# Patient Record
Sex: Female | Born: 1980 | Race: Black or African American | Hispanic: No | Marital: Married | Smoking: Never smoker
Health system: Southern US, Community
[De-identification: ages and names within clinical notes are randomized; demographics above are authoritative.]

## PROBLEM LIST (undated history)

## (undated) DIAGNOSIS — L659 Nonscarring hair loss, unspecified: Secondary | ICD-10-CM

## (undated) DIAGNOSIS — E119 Type 2 diabetes mellitus without complications: Secondary | ICD-10-CM

## (undated) DIAGNOSIS — O24419 Gestational diabetes mellitus in pregnancy, unspecified control: Secondary | ICD-10-CM

## (undated) DIAGNOSIS — I10 Essential (primary) hypertension: Secondary | ICD-10-CM

## (undated) DIAGNOSIS — E785 Hyperlipidemia, unspecified: Secondary | ICD-10-CM

## (undated) DIAGNOSIS — M2550 Pain in unspecified joint: Secondary | ICD-10-CM

## (undated) HISTORY — DX: Pain in unspecified joint: M25.50

## (undated) HISTORY — DX: Essential (primary) hypertension: I10

## (undated) HISTORY — PX: BREAST SURGERY: SHX581

## (undated) HISTORY — DX: Hyperlipidemia, unspecified: E78.5

## (undated) HISTORY — DX: Gestational diabetes mellitus in pregnancy, unspecified control: O24.419

## (undated) HISTORY — DX: Nonscarring hair loss, unspecified: L65.9

## (undated) HISTORY — DX: Type 2 diabetes mellitus without complications: E11.9

---

## 2005-09-03 ENCOUNTER — Emergency Department (HOSPITAL_COMMUNITY): Admission: EM | Admit: 2005-09-03 | Discharge: 2005-09-03 | Payer: Self-pay | Admitting: Emergency Medicine

## 2010-01-18 ENCOUNTER — Ambulatory Visit (HOSPITAL_BASED_OUTPATIENT_CLINIC_OR_DEPARTMENT_OTHER): Admission: RE | Admit: 2010-01-18 | Discharge: 2010-01-18 | Payer: Self-pay | Admitting: Specialist

## 2010-10-25 LAB — DIFFERENTIAL
Eosinophils Absolute: 0.1 10*3/uL (ref 0.0–0.7)
Eosinophils Relative: 1 % (ref 0–5)
Lymphocytes Relative: 29 % (ref 12–46)
Lymphs Abs: 2.1 10*3/uL (ref 0.7–4.0)
Monocytes Relative: 5 % (ref 3–12)
Neutrophils Relative %: 65 % (ref 43–77)

## 2010-10-25 LAB — BASIC METABOLIC PANEL
Chloride: 109 mEq/L (ref 96–112)
GFR calc Af Amer: >60 mL/min (ref >60)
GFR calc non Af Amer: >60 mL/min (ref >60)
Potassium: 4.2 mEq/L (ref 3.5–5.1)

## 2010-10-25 LAB — CBC
HCT: 36.7 % (ref 36.0–46.0)
MCV: 86.4 fL (ref 78.0–100.0)
RBC: 4.24 MIL/uL (ref 3.87–5.11)
WBC: 7.4 10*3/uL (ref 4.0–10.5)

## 2011-07-22 ENCOUNTER — Other Ambulatory Visit: Payer: Self-pay | Admitting: Specialist

## 2011-10-28 ENCOUNTER — Other Ambulatory Visit (HOSPITAL_COMMUNITY)
Admission: RE | Admit: 2011-10-28 | Discharge: 2011-10-28 | Disposition: A | Discharge status: Home / Self Care | Payer: Managed Care, Other (non HMO) | Source: Ambulatory Visit | Attending: Obstetrics & Gynecology | Admitting: Obstetrics & Gynecology

## 2011-10-28 ENCOUNTER — Other Ambulatory Visit: Payer: Self-pay | Admitting: Obstetrics & Gynecology

## 2011-10-28 DIAGNOSIS — Z124 Encounter for screening for malignant neoplasm of cervix: Secondary | ICD-10-CM | POA: Insufficient documentation

## 2011-10-28 DIAGNOSIS — Z113 Encounter for screening for infections with a predominantly sexual mode of transmission: Secondary | ICD-10-CM | POA: Insufficient documentation

## 2011-10-28 DIAGNOSIS — Z1159 Encounter for screening for other viral diseases: Secondary | ICD-10-CM | POA: Insufficient documentation

## 2012-11-08 ENCOUNTER — Encounter (HOSPITAL_BASED_OUTPATIENT_CLINIC_OR_DEPARTMENT_OTHER): Payer: Self-pay | Admitting: Emergency Medicine

## 2012-11-08 ENCOUNTER — Emergency Department (HOSPITAL_BASED_OUTPATIENT_CLINIC_OR_DEPARTMENT_OTHER)
Admission: EM | Admit: 2012-11-08 | Discharge: 2012-11-08 | Disposition: A | Discharge status: Home / Self Care | Payer: Managed Care, Other (non HMO) | Attending: Emergency Medicine | Admitting: Emergency Medicine

## 2012-11-08 DIAGNOSIS — R1110 Vomiting, unspecified: Secondary | ICD-10-CM | POA: Insufficient documentation

## 2012-11-08 DIAGNOSIS — R197 Diarrhea, unspecified: Secondary | ICD-10-CM

## 2012-11-08 LAB — URINALYSIS, ROUTINE W REFLEX MICROSCOPIC
Ketones, ur: 15 mg/dL — AB
Leukocytes, UA: NEGATIVE
Protein, ur: NEGATIVE mg/dL
Urobilinogen, UA: 1 mg/dL (ref 0.0–1.0)

## 2012-11-08 LAB — COMPREHENSIVE METABOLIC PANEL
Albumin: 3.8 g/dL (ref 3.5–5.2)
Alkaline Phosphatase: 97 U/L (ref 39–117)
BUN: 14 mg/dL (ref 6–23)
CO2: 27 mEq/L (ref 19–32)
Chloride: 104 mEq/L (ref 96–112)
Creatinine, Ser: 0.8 mg/dL (ref 0.50–1.10)
GFR calc Af Amer: >90 mL/min (ref >90)
GFR calc non Af Amer: >90 mL/min (ref >90)
Glucose, Bld: 96 mg/dL (ref 70–99)
Potassium: 4 mEq/L (ref 3.5–5.1)
Total Bilirubin: 0.5 mg/dL (ref 0.3–1.2)

## 2012-11-08 LAB — CBC WITH DIFFERENTIAL/PLATELET
Eosinophils Relative: 0 % (ref 0–5)
HCT: 36.3 % (ref 36.0–46.0)
Lymphs Abs: 1.7 10*3/uL (ref 0.7–4.0)
MCH: 26.5 pg (ref 26.0–34.0)
MCV: 80.3 fL (ref 78.0–100.0)
Monocytes Absolute: 0.3 10*3/uL (ref 0.1–1.0)
Neutro Abs: 4.9 10*3/uL (ref 1.7–7.7)
Platelets: 357 10*3/uL (ref 150–400)
RBC: 4.52 MIL/uL (ref 3.87–5.11)

## 2012-11-08 LAB — PREGNANCY, URINE: Preg Test, Ur: NEGATIVE

## 2012-11-08 MED ORDER — ONDANSETRON HCL 4 MG/2ML IJ SOLN
4.0000 mg | Freq: Once | INTRAMUSCULAR | Status: AC
  Administered 2012-11-08: 4 mg via INTRAVENOUS
  Filled 2012-11-08: qty 2

## 2012-11-08 MED ORDER — FAMOTIDINE IN NACL 20-0.9 MG/50ML-% IV SOLN
20.0000 mg | Freq: Once | INTRAVENOUS | Status: AC
  Administered 2012-11-08: 20 mg via INTRAVENOUS
  Filled 2012-11-08: qty 50

## 2012-11-08 MED ORDER — ONDANSETRON 4 MG PO TBDP: 4.0000 mg | ORAL_TABLET | Freq: Three times a day (TID) | ORAL | Status: DC | PRN

## 2012-11-08 NOTE — ED Provider Notes (Signed)
History     CSN: [id number]  Arrival date & time 11/08/12  1008   First MD Initiated Contact with Patient 11/08/12 1209      Chief Complaint  Patient presents with  . Emesis  . Diarrhea    (Consider location/radiation/quality/duration/timing/severity/associated sxs/prior treatment) Patient is a 32 y.o. female presenting with vomiting. The history is provided by the patient. No language interpreter was used.  Emesis Severity:  Moderate Duration:  4 days Timing:  Constant Quality:  Bilious material Able to tolerate:  Liquids Progression:  Worsening Chronicity:  New Recent urination:  Normal Relieved by:  Nothing Associated symptoms: diarrhea   Associated symptoms: no abdominal pain   Risk factors: no diabetes     History reviewed. No pertinent past medical history.  Past Surgical History  Procedure Laterality Date  . Breast surgery      No family history on file.  History  Substance Use Topics  . Smoking status: Never Smoker   . Smokeless tobacco: Not on file  . Alcohol Use: Yes     Comment: socially    OB History   Grav Para Term Preterm Abortions TAB SAB Ect Mult Living                  Review of Systems  Gastrointestinal: Positive for vomiting and diarrhea. Negative for abdominal pain.  All other systems reviewed and are negative.    Allergies  Review of patient's allergies indicates no known allergies.  Home Medications  No current outpatient prescriptions on file.  BP 141/85  Pulse 95  Temp(Src) 98.7 F (37.1 C) (Oral)  Resp 18  Ht 5\' 9"  (1.753 m)  Wt 270 lb 8 oz (122.698 kg)  BMI 39.93 kg/m2  SpO2 97%  LMP 08/04/2012  Physical Exam  Nursing note and vitals reviewed. Constitutional: She is oriented to person, place, and time. She appears well-developed.  HENT:  Head: Normocephalic.  Eyes: Pupils are equal, round, and reactive to light.  Neck: Normal range of motion.  Cardiovascular: Normal rate.   Pulmonary/Chest: Effort normal.   Abdominal: Soft. Bowel sounds are normal.  Musculoskeletal: Normal range of motion.  Neurological: She is alert and oriented to person, place, and time.  Skin: Skin is warm.  Psychiatric: She has a normal mood and affect.    ED Course  Procedures (including critical care time)  Labs Reviewed  URINALYSIS, ROUTINE W REFLEX MICROSCOPIC - Abnormal; Notable for the following:    APPearance CLOUDY (*)    Ketones, ur 15 (*)    All other components within normal limits  CBC WITH DIFFERENTIAL  COMPREHENSIVE METABOLIC PANEL  PREGNANCY, URINE  CBC WITH DIFFERENTIAL  COMPREHENSIVE METABOLIC PANEL   No results found.   No diagnosis found.    MDM  Pt given IV fluids,  zofran and pepcid for symptoms.    Pt advised to return if symptoms persist.   Pt given rx for zofran.           Lonia Skinner [place], PA-C 11/08/12 1514

## 2012-11-08 NOTE — ED Notes (Signed)
Poor skin turgor.

## 2012-11-08 NOTE — ED Provider Notes (Signed)
Medical screening examination/treatment/procedure(s) were performed by non-physician practitioner and as supervising physician I was immediately available for consultation/collaboration.   Zyron Deeley III, MD 11/08/12 2037 

## 2012-11-08 NOTE — ED Notes (Addendum)
N/V/D since 11/05/12.  Unable to keep anything down.  C/o abd. pain and back pain.  LMP 08/04/12, miscarried in mid-March, hasn't had another period since.

## 2014-09-30 ENCOUNTER — Emergency Department (HOSPITAL_BASED_OUTPATIENT_CLINIC_OR_DEPARTMENT_OTHER): Payer: BLUE CROSS/BLUE SHIELD

## 2014-09-30 ENCOUNTER — Encounter (HOSPITAL_BASED_OUTPATIENT_CLINIC_OR_DEPARTMENT_OTHER): Payer: Self-pay | Admitting: *Deleted

## 2014-09-30 ENCOUNTER — Emergency Department (HOSPITAL_BASED_OUTPATIENT_CLINIC_OR_DEPARTMENT_OTHER)
Admission: EM | Admit: 2014-09-30 | Discharge: 2014-10-01 | Disposition: A | Discharge status: Home / Self Care | Payer: BLUE CROSS/BLUE SHIELD | Attending: Emergency Medicine | Admitting: Emergency Medicine

## 2014-09-30 DIAGNOSIS — Y998 Other external cause status: Secondary | ICD-10-CM | POA: Diagnosis not present

## 2014-09-30 DIAGNOSIS — Y9389 Activity, other specified: Secondary | ICD-10-CM | POA: Insufficient documentation

## 2014-09-30 DIAGNOSIS — S161XXA Strain of muscle, fascia and tendon at neck level, initial encounter: Secondary | ICD-10-CM | POA: Diagnosis not present

## 2014-09-30 DIAGNOSIS — Y92410 Unspecified street and highway as the place of occurrence of the external cause: Secondary | ICD-10-CM | POA: Insufficient documentation

## 2014-09-30 DIAGNOSIS — S199XXA Unspecified injury of neck, initial encounter: Secondary | ICD-10-CM | POA: Diagnosis present

## 2014-09-30 MED ORDER — METHOCARBAMOL 500 MG PO TABS
1000.0000 mg | ORAL_TABLET | Freq: Once | ORAL | Status: AC
  Administered 2014-09-30: 1000 mg via ORAL
  Filled 2014-09-30: qty 2

## 2014-09-30 MED ORDER — KETOROLAC TROMETHAMINE 60 MG/2ML IM SOLN
60.0000 mg | Freq: Once | INTRAMUSCULAR | Status: AC
  Administered 2014-09-30: 60 mg via INTRAMUSCULAR
  Filled 2014-09-30: qty 2
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
## 2014-10-01 ENCOUNTER — Encounter (HOSPITAL_BASED_OUTPATIENT_CLINIC_OR_DEPARTMENT_OTHER): Payer: Self-pay | Admitting: Emergency Medicine

## 2014-10-01 MED ORDER — METHOCARBAMOL 500 MG PO TABS: 500.0000 mg | ORAL_TABLET | Freq: Two times a day (BID) | ORAL | Status: DC

## 2014-10-01 MED ORDER — TRAMADOL HCL 50 MG PO TABS: 50.0000 mg | ORAL_TABLET | Freq: Four times a day (QID) | ORAL | Status: DC | PRN

## 2014-10-01 MED ORDER — NAPROXEN 375 MG PO TABS: 375.0000 mg | ORAL_TABLET | Freq: Two times a day (BID) | ORAL | Status: DC

## 2016-11-19 ENCOUNTER — Emergency Department (HOSPITAL_BASED_OUTPATIENT_CLINIC_OR_DEPARTMENT_OTHER): Payer: BLUE CROSS/BLUE SHIELD

## 2016-11-19 ENCOUNTER — Encounter (HOSPITAL_BASED_OUTPATIENT_CLINIC_OR_DEPARTMENT_OTHER): Payer: Self-pay | Admitting: *Deleted

## 2016-11-19 ENCOUNTER — Emergency Department (HOSPITAL_BASED_OUTPATIENT_CLINIC_OR_DEPARTMENT_OTHER)
Admission: EM | Admit: 2016-11-19 | Discharge: 2016-11-19 | Disposition: A | Discharge status: Home / Self Care | Payer: BLUE CROSS/BLUE SHIELD | Attending: Emergency Medicine | Admitting: Emergency Medicine

## 2016-11-19 DIAGNOSIS — W108XXA Fall (on) (from) other stairs and steps, initial encounter: Secondary | ICD-10-CM | POA: Insufficient documentation

## 2016-11-19 DIAGNOSIS — O9A211 Injury, poisoning and certain other consequences of external causes complicating pregnancy, first trimester: Secondary | ICD-10-CM | POA: Diagnosis present

## 2016-11-19 DIAGNOSIS — Y92009 Unspecified place in unspecified non-institutional (private) residence as the place of occurrence of the external cause: Secondary | ICD-10-CM | POA: Diagnosis not present

## 2016-11-19 DIAGNOSIS — S93402A Sprain of unspecified ligament of left ankle, initial encounter: Secondary | ICD-10-CM | POA: Insufficient documentation

## 2016-11-19 DIAGNOSIS — W19XXXA Unspecified fall, initial encounter: Secondary | ICD-10-CM

## 2016-11-19 DIAGNOSIS — Y9389 Activity, other specified: Secondary | ICD-10-CM | POA: Insufficient documentation

## 2016-11-19 DIAGNOSIS — Z331 Pregnant state, incidental: Secondary | ICD-10-CM

## 2016-11-19 DIAGNOSIS — Y999 Unspecified external cause status: Secondary | ICD-10-CM | POA: Insufficient documentation

## 2016-11-19 DIAGNOSIS — Z3A01 Less than 8 weeks gestation of pregnancy: Secondary | ICD-10-CM | POA: Insufficient documentation

## 2016-11-19 LAB — PREGNANCY, URINE: Preg Test, Ur: POSITIVE — AB

## 2016-11-19 MED ORDER — PRENATAL COMPLETE 14-0.4 MG PO TABS: 1.0000 | ORAL_TABLET | Freq: Every day | ORAL | 2 refills | Status: AC

## 2016-11-19 NOTE — ED Triage Notes (Signed)
Pt reports left ankle pain since falling off the porch last night.  Denies other injuries.  No meds PTA

## 2016-11-19 NOTE — ED Provider Notes (Signed)
[facility] Provider [facility]   CSN: [id number] Arrival date & time: 11/19/16  1050     History   Chief Complaint Chief Complaint  Patient presents with  . Fall    HPI Pamela Carroll is a 36 y.o. female who presents to the ED with complaints of left ankle pain since yesterday after a mechanical fall. Patient states that she was coming down a few steps on a porch, and twisted her ankle causing her to fall, she denies any head injury or abdominal trauma, denies any other injuries sustained aside from the ankle. She describes her ankle pain is 8/10 constant throbbing nonradiating left ankle pain, worse with weightbearing and moving/twisting her ankle, and mildly improved with ice. She reports associated swelling, bruising, and some tingling in the fourth and fifth toes. She denies any head injury, LOC, fevers, chills, CP, SOB, abd trauma, abd pain, N/V/D/C, hematuria, dysuria, vaginal bleeding/discharge, myalgias, other extremity injuries, numbness, focal weakness, wounds/abrasions, or any other complaints at this time.    The history is provided by the patient and medical records. No language interpreter was used.  Ankle Pain   The incident occurred yesterday. The incident occurred at home. The injury mechanism was a fall. The pain is present in the left ankle. The quality of the pain is described as throbbing. The pain is at a severity of 8/10. The pain is moderate. The pain has been constant since onset. Associated symptoms include inability to bear weight and tingling. Pertinent negatives include no numbness, no muscle weakness and no loss of sensation. She reports no foreign bodies present. The symptoms are aggravated by bearing weight. She has tried ice for the symptoms. The treatment provided mild relief.    History reviewed. No pertinent past medical history.  There are no active problems to display for this patient.   Past Surgical History:  Procedure Laterality Date    . BREAST SURGERY      OB History    Gravida Para Term Preterm AB Living   1             SAB TAB Ectopic Multiple Live Births                   Home Medications    Prior to Admission medications   Not on File    Family History History reviewed. No pertinent family history.  Social History Social History  Substance Use Topics  . Smoking status: Never Smoker  . Smokeless tobacco: Not on file  . Alcohol use Yes     Comment: socially     Allergies   Patient has no known allergies.   Review of Systems Review of Systems  Constitutional: Negative for chills and fever.  HENT: Negative for facial swelling (no head inj).   Respiratory: Negative for shortness of breath.   Cardiovascular: Negative for chest pain.  Gastrointestinal: Negative for abdominal pain, constipation, diarrhea, nausea and vomiting.  Genitourinary: Negative for dysuria, hematuria, vaginal bleeding and vaginal discharge.  Musculoskeletal: Positive for arthralgias and joint swelling. Negative for myalgias.  Skin: Positive for color change. Negative for wound.  Allergic/Immunologic: Negative for immunocompromised state.  Neurological: Positive for tingling. Negative for syncope, weakness and numbness.  Psychiatric/Behavioral: Negative for confusion.   10 Systems reviewed and are negative for acute change except as noted in the HPI.   Physical Exam Updated Vital Signs BP 139/82 (BP Location: Left Arm)   Pulse 100   Temp 98.4 F (36.9 C) (Oral)  Resp 18   Ht  (1.727 m)   Wt 131.5 kg   LMP 10/09/2016 (Exact Date)   SpO2 98%   BMI 44.09 kg/m   Physical Exam  Constitutional: She is oriented to person, place, and time. Vital signs are normal. She appears well-developed and well-nourished.  Non-toxic appearance. No distress.  Afebrile, nontoxic, NAD  HENT:  Head: Normocephalic and atraumatic.  Mouth/Throat: Oropharynx is clear and moist and mucous membranes are normal.  Eyes: Conjunctivae  and EOM are normal. Right eye exhibits no discharge. Left eye exhibits no discharge.  Neck: Normal range of motion. Neck supple.  Cardiovascular: Normal rate, regular rhythm, normal heart sounds and intact distal pulses.  Exam reveals no gallop and no friction rub.   No murmur heard. Pulmonary/Chest: Effort normal and breath sounds normal. No respiratory distress. She has no decreased breath sounds. She has no wheezes. She has no rhonchi. She has no rales.  Abdominal: Soft. Normal appearance and bowel sounds are normal. She exhibits no distension. There is no tenderness. There is no rigidity, no rebound, no guarding, no CVA tenderness, no tenderness at McBurney's point and negative Murphy's sign.  Obese which limits exam slightly, but overall Soft, NTND, +BS throughout, no r/g/r, neg murphy's, neg mcburney's, no CVA TTP   Musculoskeletal: Normal range of motion.       Left ankle: She exhibits swelling and ecchymosis. She exhibits normal range of motion, no deformity, no laceration and normal pulse. Tenderness. Lateral malleolus and medial malleolus tenderness found. Achilles tendon normal.  L ankle with FROM intact, +swelling, no crepitus or deformity, with mild TTP of the lateral and medial malleoli, but no TTP or swelling of fore foot or calf. No break in skin. No bruising or erythema. No warmth. Achilles intact. Good pedal pulse and cap refill of all toes. Wiggling toes without difficulty. Sensation grossly intact. Soft compartments. No other areas of tenderness to remainder of leg.   Neurological: She is alert and oriented to person, place, and time. She has normal strength. No sensory deficit.  Skin: Skin is warm, dry and intact. No rash noted.  Psychiatric: She has a normal mood and affect.  Nursing note and vitals reviewed.    ED Treatments / Results  Labs (all labs ordered are listed, but only abnormal results are displayed) Labs Reviewed  PREGNANCY, URINE - Abnormal; Notable for the  following:       Result Value   Preg Test, Ur POSITIVE (*)    All other components within normal limits    EKG  EKG Interpretation None       Radiology Dg Ankle Complete Left  Result Date: 11/19/2016 CLINICAL DATA:  36 year old female with LEFT lateral malleolus and prox foot pain EXAM: LEFT ANKLE COMPLETE - 3+ VIEW COMPARISON:  None. FINDINGS: Ankle mortise intact. The talar dome is normal. No malleolar fracture. The calcaneus is normal. Soft tissue swelling over the lateral malleolus. IMPRESSION: 1.  No fracture or dislocation. 2. Soft tissue swelling of the lateral malleolus. Electronically Signed   By: Genevive Bi M.D.   On: 11/19/2016 13:39   Dg Foot Complete Left  Result Date: 11/19/2016 CLINICAL DATA:  Fall last night.  Pain. EXAM: LEFT FOOT - COMPLETE 3+ VIEW COMPARISON:  None. FINDINGS: No acute fracture. No dislocation. Unremarkable soft tissues. Spurring of the posterior and inferior calcaneus. IMPRESSION: No acute bony pathology. Electronically Signed   By: Jolaine Click M.D.   On: 11/19/2016 13:36  Procedures Procedures (including critical care time)  Medications Ordered in ED Medications - No data to display   Initial Impression / Assessment and Plan / ED Course  I have reviewed the triage vital signs and the nursing notes.  Pertinent labs & imaging results that were available during my care of the patient were reviewed by me and considered in my medical decision making (see chart for details).     36 y.o. female here with mechanical fall c/o L ankle pain/swelling/bruising. States last night she twisted her ankle coming down a porch step, states it caused her to fall but denies any other injuries sustained. On exam, mild swelling/bruising/tenderness to lateral malleolus, mild TTP to medial malleolus as well. FROM intact, NVI with soft compartments. No abdominal tenderness, no other areas of tenderness. Xrays pending. Unsure why but pt had Upreg done;  during initial eval, pt's husband was present and when I went to discuss the fact that a Upreg had been done, she began mouthing the words "shut up" to me. I did not give her the results at that time, will have her husband removed from the room and go back in and discuss the results, as well as advise pt that her behavior is inappropriate. She denies any abd pain/vaginal complaints, at this time I doubt we need to do any further work up regarding the Upreg+, but will further discuss with pt once I go back in. Will reassess shortly  2:00 PM Xrays negative; ankle injury likely sprain. Once I discussed that the pt's behavior earlier was inappropriate, she got agitated and upset because she states that my job was only to evaluate her ankle; I advised her that her pregnancy test was positive and she reported that she already knew that; she continued to be very rude and inappropriate and after multiple attempts at calming her down, I advised her that I would be leaving and that she can f/up with her OBGYN for ongoing prenatal care. Will start on prenatals. For her ankle, advised tylenol use, RICE, crutches for comfort, ankle brace, and f/up with ortho in 1-2wks for recheck of symptoms. I explained the diagnosis and have given explicit precautions to return to the ER including for any other new or worsening symptoms. The patient reported understanding and accepted the medical plan as it's been dictated and I have answered their questions, despite her being very agitated when I left the room. Discharge instructions concerning home care and prescriptions have been given. The patient is STABLE and is discharged to home in good condition.    Final Clinical Impressions(s) / ED Diagnoses   Final diagnoses:  Sprain of left ankle, unspecified ligament, initial encounter  Fall in home, initial encounter  Pregnancy, incidental    New Prescriptions New Prescriptions   PRENATAL VIT-FE FUMARATE-FA (PRENATAL COMPLETE)  14-0.4 MG TABS    Take 1 tablet by mouth daily after breakfast.     [address] PA-C 11/19/16 1402    Vanetta Mulders, MD 11/20/16 [phone]

## 2016-11-19 NOTE — ED Notes (Signed)
Pt stated she did not want crutches that she had some at home.

## 2016-11-19 NOTE — Discharge Instructions (Signed)
Wear ankle brace for at least 2 weeks for stabilization of ankle. Use crutches as needed for comfort. Ice and elevate ankle throughout the day, using ice pack for no more than 20 minutes every hour.  Use tylenol as needed for pain. Follow up with the orthopedist in the next 5-7 days for recheck of your ankle pain and ongoing management of your sprain. Return to the ER for changes or worsening symptoms.  Your pregnancy test was positive here. Start taking prenatal vitamins. Stay well hydrated and get plenty of rest. Follow up with your OBGYN in 1 week for ongoing prenatal care. [facility] any emergent changes/worsening conditions.

## 2016-12-08 ENCOUNTER — Ambulatory Visit: Payer: Managed Care, Other (non HMO) | Admitting: Registered"

## 2017-10-01 IMAGING — DX DG ANKLE COMPLETE 3+V*L*
3 series · 3 of 3 positions shown · non-contrast
Comparison: None.

CLINICAL DATA: [age] year old female with LEFT lateral
malleolus and prox foot pain

EXAM:
LEFT ANKLE COMPLETE - 3+ VIEW

[ankle ap]
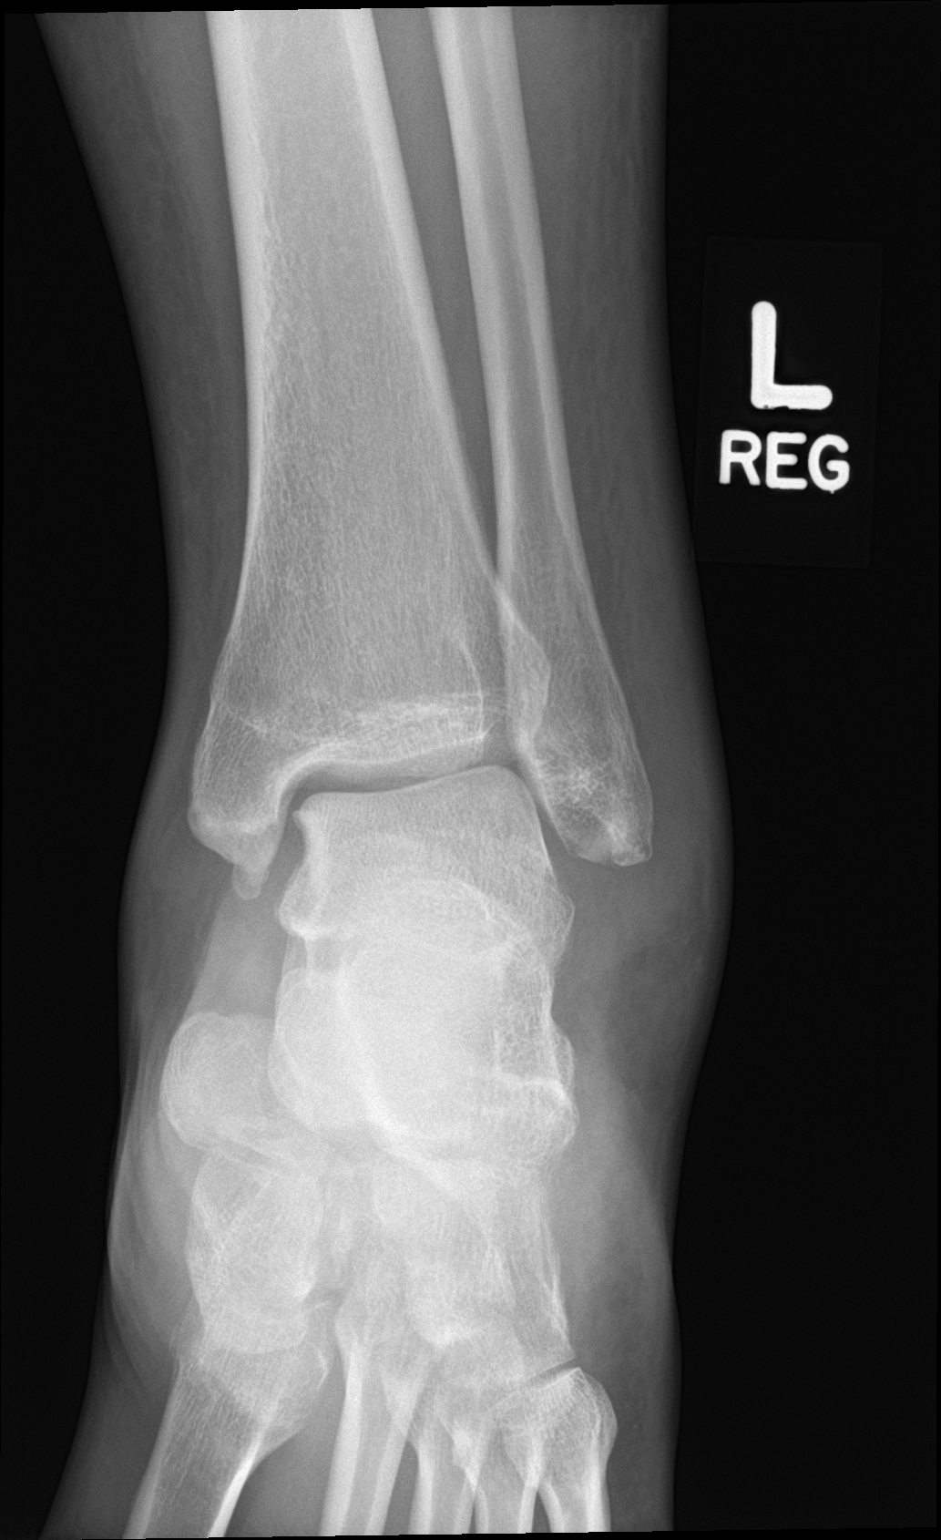

[ankle obl]
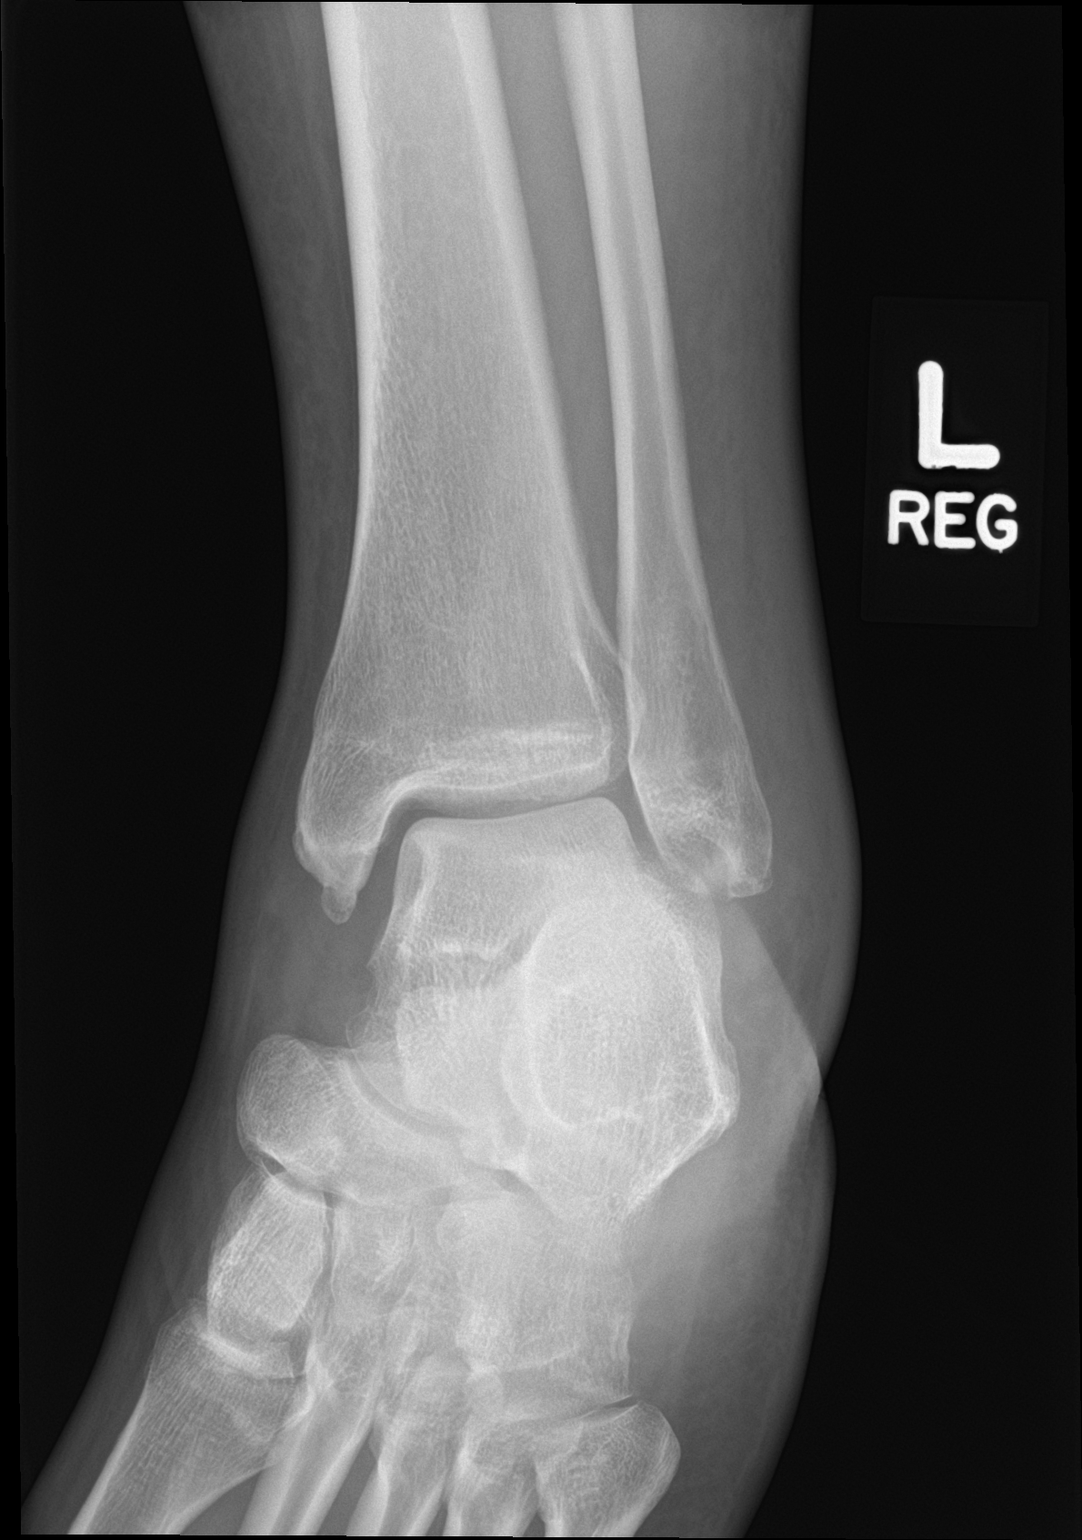

[ankle lat]
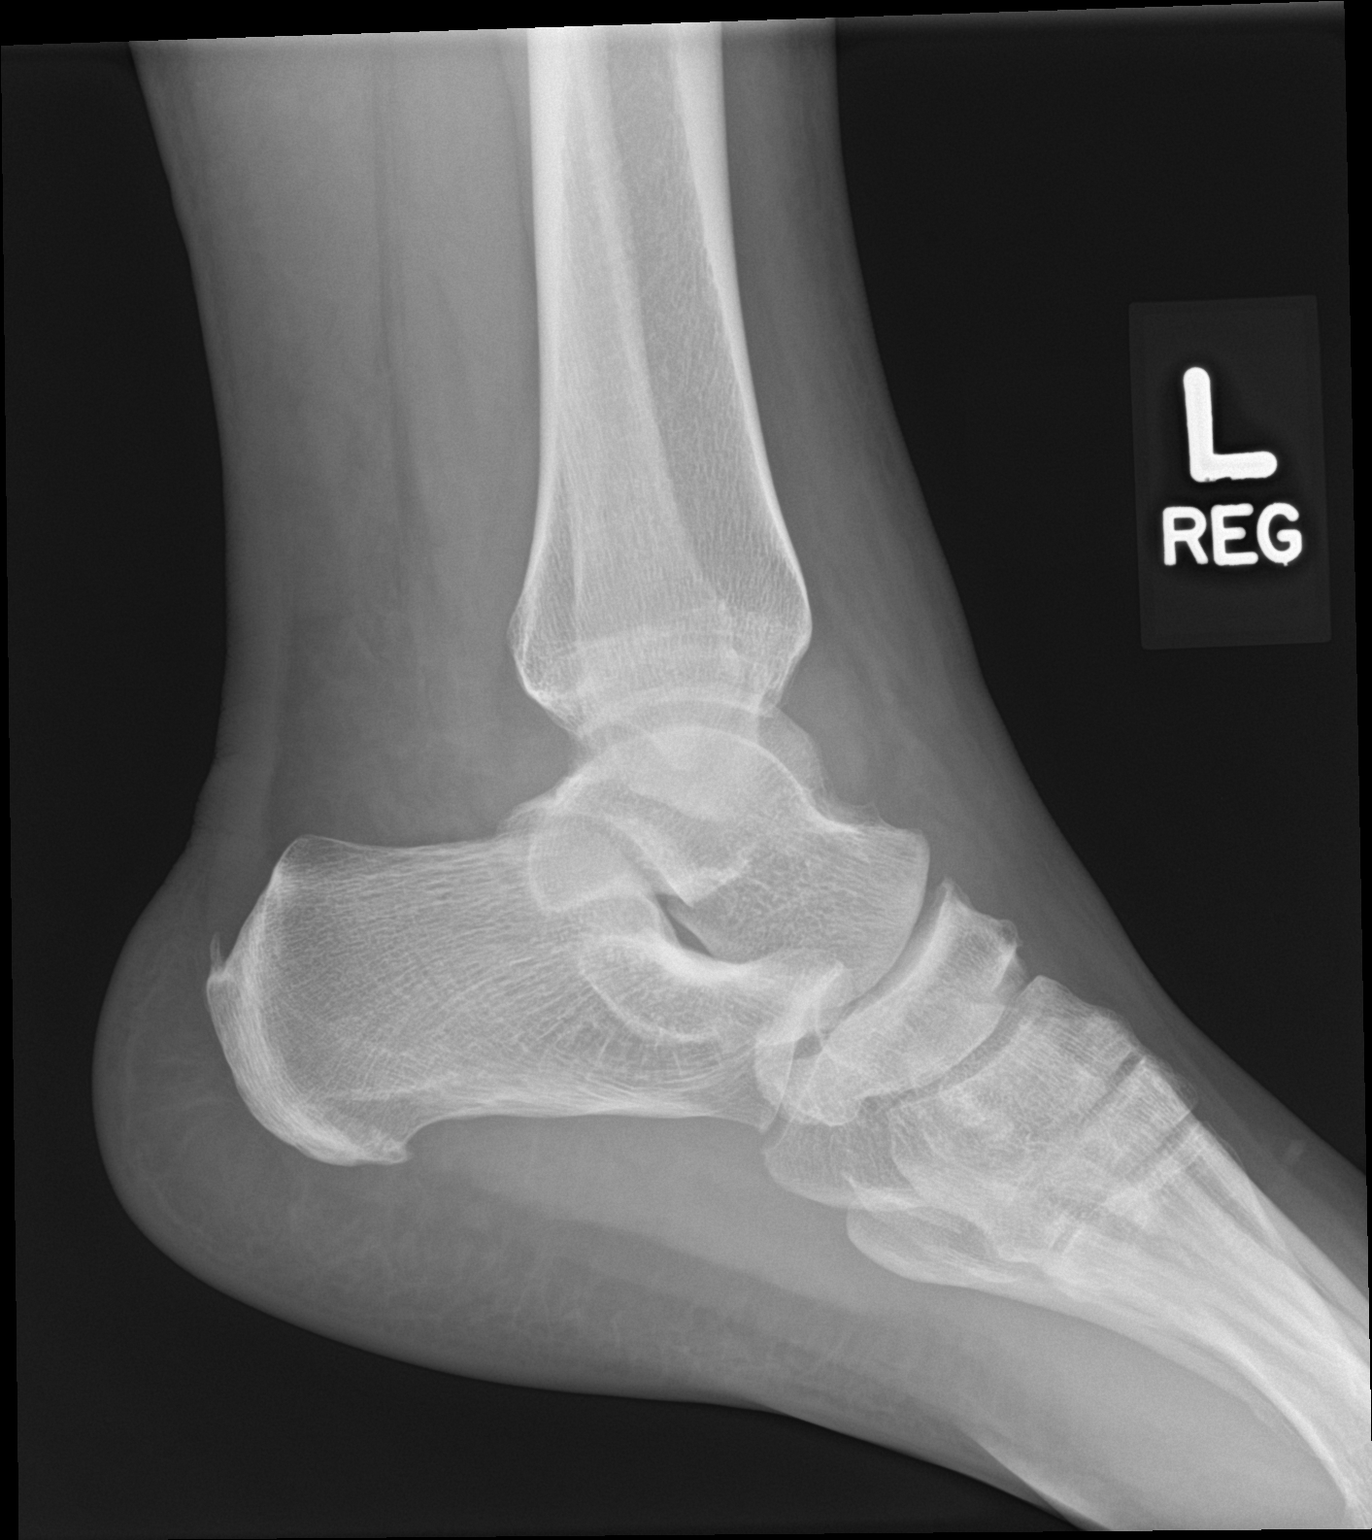

[3 of 3 positions shown; findings below may reference images not displayed]

FINDINGS: Ankle mortise intact. The talar dome is normal. No malleolar
fracture. The calcaneus is normal. Soft tissue swelling over the
lateral malleolus.
IMPRESSION: 1.  No fracture or dislocation.
2. Soft tissue swelling of the lateral malleolus.

## 2021-03-22 ENCOUNTER — Emergency Department (HOSPITAL_BASED_OUTPATIENT_CLINIC_OR_DEPARTMENT_OTHER): Payer: 59

## 2021-03-22 ENCOUNTER — Emergency Department (HOSPITAL_BASED_OUTPATIENT_CLINIC_OR_DEPARTMENT_OTHER)
Admission: EM | Admit: 2021-03-22 | Discharge: 2021-03-22 | Disposition: A | Discharge status: Home / Self Care | Payer: 59 | Attending: Emergency Medicine | Admitting: Emergency Medicine

## 2021-03-22 ENCOUNTER — Encounter (HOSPITAL_BASED_OUTPATIENT_CLINIC_OR_DEPARTMENT_OTHER): Payer: Self-pay | Admitting: *Deleted

## 2021-03-22 ENCOUNTER — Other Ambulatory Visit: Payer: Self-pay

## 2021-03-22 DIAGNOSIS — Y92410 Unspecified street and highway as the place of occurrence of the external cause: Secondary | ICD-10-CM | POA: Diagnosis not present

## 2021-03-22 DIAGNOSIS — S161XXA Strain of muscle, fascia and tendon at neck level, initial encounter: Secondary | ICD-10-CM | POA: Diagnosis not present

## 2021-03-22 DIAGNOSIS — S39012A Strain of muscle, fascia and tendon of lower back, initial encounter: Secondary | ICD-10-CM | POA: Diagnosis not present

## 2021-03-22 DIAGNOSIS — S3992XA Unspecified injury of lower back, initial encounter: Secondary | ICD-10-CM | POA: Diagnosis present

## 2021-03-22 DIAGNOSIS — S0990XA Unspecified injury of head, initial encounter: Secondary | ICD-10-CM | POA: Insufficient documentation

## 2021-03-22 LAB — PREGNANCY, URINE: Preg Test, Ur: NEGATIVE
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
## 2021-03-22 NOTE — Discharge Instructions (Signed)
Take Naprosyn or over-the-counter Aleve 500 mg every 12 hours.  Work note provided.  Expect to be sore and stiff for the next few days.  Follow-up with your doctors as needed.
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
## 2022-02-19 LAB — GLUCOSE, POCT (MANUAL RESULT ENTRY): POC Glucose: 123 mg/dl — AB (ref 70–99)

## 2022-10-06 ENCOUNTER — Other Ambulatory Visit: Payer: Self-pay | Admitting: Nurse Practitioner

## 2022-10-06 DIAGNOSIS — E119 Type 2 diabetes mellitus without complications: Secondary | ICD-10-CM

## 2022-10-06 DIAGNOSIS — I10 Essential (primary) hypertension: Secondary | ICD-10-CM

## 2022-11-24 ENCOUNTER — Encounter (HOSPITAL_BASED_OUTPATIENT_CLINIC_OR_DEPARTMENT_OTHER): Payer: Self-pay | Admitting: Family

## 2022-11-24 ENCOUNTER — Ambulatory Visit (HOSPITAL_BASED_OUTPATIENT_CLINIC_OR_DEPARTMENT_OTHER): Payer: BC Managed Care – PPO | Admitting: Family

## 2022-11-24 VITALS — BP 162/94 | HR 73 | Ht 69.0 in | Wt 262.0 lb

## 2022-11-24 DIAGNOSIS — I10 Essential (primary) hypertension: Secondary | ICD-10-CM

## 2022-11-24 DIAGNOSIS — Z6838 Body mass index (BMI) 38.0-38.9, adult: Secondary | ICD-10-CM | POA: Diagnosis not present

## 2022-11-24 DIAGNOSIS — E782 Mixed hyperlipidemia: Secondary | ICD-10-CM

## 2022-11-24 DIAGNOSIS — G4730 Sleep apnea, unspecified: Secondary | ICD-10-CM

## 2022-11-24 DIAGNOSIS — R0683 Snoring: Secondary | ICD-10-CM

## 2022-11-24 DIAGNOSIS — Z006 Encounter for examination for normal comparison and control in clinical research program: Secondary | ICD-10-CM

## 2022-11-24 NOTE — Addendum Note (Signed)
Addended by: Marlene Lard on: 11/24/2022 01:36 PM   Modules accepted: Orders

## 2022-11-24 NOTE — Patient Instructions (Addendum)
Medication Instructions:  None ordered today  Based on blood pressure readings we may think about a medication called Amlodipine.    Labwork: Your physician recommends that you return for lab work within the next two weeks for catecholamines, metanephrines, thyroid panel, BMP. This is to look for any secondary causes of high blood pressure.    Testing/Procedures: Your physician has requested that you have a renal artery duplex. During this test, an ultrasound is used to evaluate blood flow to the kidneys. Allow one hour for this exam. Do not eat after midnight the day before and avoid carbonated beverages. Take your medications as you usually do.  WatchPAT?  Is a FDA cleared portable home sleep study test that uses a watch and 3 points of contact to monitor 7 different channels, including your heart rate, oxygen saturations, body position, snoring, and chest motion.  The study is easy to use from the comfort of your own home and accurately detect sleep apnea.  Before bed, you attach the chest sensor, attached the sleep apnea bracelet to your nondominant hand, and attach the finger probe.  After the study, the raw data is downloaded from the watch and scored for apnea events.   For more information: [email or web address]  Patient Testing Instructions:  Do not put battery into the device until bedtime when you are ready to begin the test. Please call the support number if you need assistance after following the instructions below: 24 hour support line- [phone] or [facility] support at [phone] (option 2)  Download the IntelWatchPAT One" app through the google play store or App Store  Be sure to turn on or enable access to bluetooth in settlings on your smartphone/ device  Make sure no other bluetooth devices are on and within the vicinity of your smartphone/ device and WatchPAT watch during testing.  Make sure to leave your smart phone/ device plugged in and charging all  night.  When ready for bed:  Follow the instructions step by step in the WatchPAT One App to activate the testing device. For additional instructions, including video instruction, visit the WatchPAT One video on Youtube. You can search for WatchPat One within Youtube (video is 4 minutes and 18 seconds) or enter: https://youtube/watch?[email or web address]_vbiwxE Please note: You will be prompted to enter a Pin to connect via bluetooth when starting the test. The PIN will be assigned to you when you receive the test.  The device is disposable, but it recommended that you retain the device until you receive a call letting you know the study has been received and the results have been interpreted.  We will let you know if the study did not transmit to [place] properly after the test is completed. You do not need to call us to confirm the receipt of the test.  Please complete the test within 48 hours of receiving PIN.   Frequently Asked Questions:  What is Watch Dennie Bible one?  A single use fully disposable home sleep apnea testing device and will not need to be returned after completion.  What are the requirements to use WatchPAT one?  The be able to have a successful watchpat one sleep study, you should have your Watch pat one device, your smart phone, watch pat one app, your PIN number and Internet access What type of phone do I need?  You should have a smart phone that uses Android 5.1 and above or any Iphone with IOS 10 and above How can I download the WatchPAT one app?  Based on your device type search for WatchPAT one app either in google play for android devices or APP store for Iphone's Where will I get my PIN for the study?  Your PIN will be provided by your physician's office. It is used for authentication and if you lose/forget your PIN, please reach out to your providers office.  I do not have Internet at home. Can I do WatchPAT one study?  WatchPAT One needs Internet connection throughout the night to be able to  transmit the sleep data. You can use your home/local internet or your cellular's data package. However, it is always recommended to use home/local Internet. It is estimated that between 20MB-30MB will be used with each study.However, the application will be looking for space in the phone to start the study.  What happens if I lose internet or bluetooth connection?  During the internet disconnection, your phone will not be able to transmit the sleep data. All the data, will be stored in your phone. As soon as the internet connection is back on, the phone will being sending the sleep data. During the bluetooth disconnection, WatchPAT one will not be able to to send the sleep data to your phone. Data will be kept in the [facility] one until two devices have bluetooth connection back on. As soon as the connection is back on, WatchPAT one will send the sleep data to the phone.  How long do I need to wear the WatchPAT one?  After you start the study, you should wear the device at least 6 hours.  How far should I keep my phone from the device?  During the night, your phone should be within 15 feet.  What happens if I leave the room for restroom or other reasons?  Leaving the room for any reason will not cause any problem. As soon as your get back to the room, both devices will reconnect and will continue to send the sleep data. Can I use my phone during the sleep study?  Yes, you can use your phone as usual during the study. But it is recommended to put your watchpat one on when you are ready to go to bed.  How will I get my study results?  A soon as you completed your study, your sleep data will be sent to the provider. They will then share the results with you when they are ready.   Follow-Up: Follow up as scheduled   Referrals:  Recommend participating in the Right Start program at [facility].    Special Instructions:   DASH Eating Plan DASH stands for Dietary Approaches to Stop  Hypertension. The DASH eating plan is a healthy eating plan that has been shown to: Reduce high blood pressure (hypertension). Reduce your risk for type 2 diabetes, heart disease, and stroke. Help with weight loss. What are tips for following this plan? Reading food labels Check food labels for the amount of salt (sodium) per serving. Choose foods with less than 5 percent of the Daily Value of sodium. Generally, foods with less than 300 milligrams (mg) of sodium per serving fit into this eating plan. To find whole grains, look for the word "whole" as the first word in the ingredient list. Shopping Buy products labeled as "low-sodium" or "no salt added." Buy fresh foods. Avoid canned foods and pre-made or frozen meals. Cooking Avoid adding salt when cooking. Use salt-free seasonings or herbs instead of table salt or sea salt. Check with your health care provider  or pharmacist before using salt substitutes. Do not fry foods. Cook foods using healthy methods such as baking, boiling, grilling, roasting, and broiling instead. Cook with heart-healthy oils, such as olive, canola, avocado, soybean, or sunflower oil. Meal planning  Eat a balanced diet that includes: 4 or more servings of fruits and 4 or more servings of vegetables each day. Try to fill one-half of your plate with fruits and vegetables. 6-8 servings of whole grains each day. Less than 6 oz (170 g) of lean meat, poultry, or fish each day. A 3-oz (85-g) serving of meat is about the same size as a deck of cards. One egg equals 1 oz (28 g). 2-3 servings of low-fat dairy each day. One serving is 1 cup (237 mL). 1 serving of nuts, seeds, or beans 5 times each week. 2-3 servings of heart-healthy fats. Healthy fats called omega-3 fatty acids are found in foods such as walnuts, flaxseeds, fortified milks, and eggs. These fats are also found in cold-water fish, such as sardines, salmon, and mackerel. Limit how much you eat of: Canned or  prepackaged foods. Food that is high in trans fat, such as some fried foods. Food that is high in saturated fat, such as fatty meat. Desserts and other sweets, sugary drinks, and other foods with added sugar. Full-fat dairy products. Do not salt foods before eating. Do not eat more than 4 egg yolks a week. Try to eat at least 2 vegetarian meals a week. Eat more home-cooked food and less restaurant, buffet, and fast food. Lifestyle When eating at a restaurant, ask that your food be prepared with less salt or no salt, if possible. If you drink alcohol: Limit how much you use to: 0-1 drink a day for women who are not pregnant. 0-2 drinks a day for men. Be aware of how much alcohol is in your drink. In the [place] one drink equals one 12 oz bottle of beer (355 mL), one 5 oz glass of wine (148 mL), or one 1 oz glass of hard liquor (44 mL). General information Avoid eating more than 2,300 mg of salt a day. If you have hypertension, you may need to reduce your sodium intake to 1,500 mg a day. Work with your health care provider to maintain a healthy body weight or to lose weight. Ask what an ideal weight is for you. Get at least 30 minutes of exercise that causes your heart to beat faster (aerobic exercise) most days of the week. Activities may include walking, swimming, or biking. Work with your health care provider or dietitian to adjust your eating plan to your individual calorie needs. What foods should I eat? Fruits All fresh, dried, or frozen fruit. Canned fruit in natural juice (without added sugar). Vegetables Fresh or frozen vegetables (raw, steamed, roasted, or grilled). Low-sodium or reduced-sodium tomato and vegetable juice. Low-sodium or reduced-sodium tomato sauce and tomato paste. Low-sodium or reduced-sodium canned vegetables. Grains Whole-grain or whole-wheat bread. Whole-grain or whole-wheat pasta. Brown rice. Orpah Cobb. Bulgur. Whole-grain and low-sodium cereals. Pita  bread. Low-fat, low-sodium crackers. Whole-wheat flour tortillas. Meats and other proteins Skinless chicken or [place]. Ground chicken or [place]. Pork with fat trimmed off. Fish and seafood. Egg whites. Dried beans, peas, or lentils. Unsalted nuts, nut butters, and seeds. Unsalted canned beans. Lean cuts of beef with fat trimmed off. Low-sodium, lean precooked or cured meat, such as sausages or meat loaves. Dairy Low-fat (1%) or fat-free (skim) milk. Reduced-fat, low-fat, or fat-free cheeses. Nonfat, low-sodium ricotta or  cottage cheese. Low-fat or nonfat yogurt. Low-fat, low-sodium cheese. Fats and oils Soft margarine without trans fats. Vegetable oil. Reduced-fat, low-fat, or light mayonnaise and salad dressings (reduced-sodium). Canola, safflower, olive, avocado, soybean, and sunflower oils. Avocado. Seasonings and condiments Herbs. Spices. Seasoning mixes without salt. Other foods Unsalted popcorn and pretzels. Fat-free sweets. The items listed above may not be a complete list of foods and beverages you can eat. Contact a dietitian for more information. What foods should I avoid? Fruits Canned fruit in a light or heavy syrup. Fried fruit. Fruit in cream or butter sauce. Vegetables Creamed or fried vegetables. Vegetables in a cheese sauce. Regular canned vegetables (not low-sodium or reduced-sodium). Regular canned tomato sauce and paste (not low-sodium or reduced-sodium). Regular tomato and vegetable juice (not low-sodium or reduced-sodium). Rosita Fire. Olives. Grains Baked goods made with fat, such as croissants, muffins, or some breads. Dry pasta or rice meal packs. Meats and other proteins Fatty cuts of meat. Ribs. Fried meat. Tomasa Blase. Bologna, salami, and other precooked or cured meats, such as sausages or meat loaves. Fat from the back of a pig (fatback). Bratwurst. Salted nuts and seeds. Canned beans with added salt. Canned or smoked fish. Whole eggs or egg yolks. Chicken or [place] with  skin. Dairy Whole or 2% milk, cream, and half-and-half. Whole or full-fat cream cheese. Whole-fat or sweetened yogurt. Full-fat cheese. Nondairy creamers. Whipped toppings. Processed cheese and cheese spreads. Fats and oils Butter. Stick margarine. Lard. Shortening. Ghee. Bacon fat. Tropical oils, such as coconut, palm kernel, or palm oil. Seasonings and condiments Onion salt, garlic salt, seasoned salt, table salt, and sea salt. Worcestershire sauce. Tartar sauce. Barbecue sauce. Teriyaki sauce. Soy sauce, including reduced-sodium. Steak sauce. Canned and packaged gravies. Fish sauce. Oyster sauce. Cocktail sauce. Store-bought horseradish. Ketchup. Mustard. Meat flavorings and tenderizers. Bouillon cubes. Hot sauces. Pre-made or packaged marinades. Pre-made or packaged taco seasonings. Relishes. Regular salad dressings. Other foods Salted popcorn and pretzels. The items listed above may not be a complete list of foods and beverages you should avoid. Contact a dietitian for more information. Where to find more information National Heart, Lung, and Blood Institute: PopSteam.is American Heart Association: www.heart.org Academy of Nutrition and Dietetics: www.eatright.org National Kidney Foundation: www.kidney.org Summary The DASH eating plan is a healthy eating plan that has been shown to reduce high blood pressure (hypertension). It may also reduce your risk for type 2 diabetes, heart disease, and stroke. When on the DASH eating plan, aim to eat more fresh fruits and vegetables, whole grains, lean proteins, low-fat dairy, and heart-healthy fats. With the DASH eating plan, you should limit salt (sodium) intake to 2,300 mg a day. If you have hypertension, you may need to reduce your sodium intake to 1,500 mg a day. Work with your health care provider or dietitian to adjust your eating plan to your individual calorie needs. This information is not intended to replace advice given to you by your  health care provider. Make sure you discuss any questions you have with your health care provider. Document Revised: 06/28/2019 Document Reviewed: 06/28/2019 Elsevier Patient Education  2023 [employer].

## 2022-11-24 NOTE — Research (Signed)
  Subject Name: Pamela Carroll met inclusion and exclusion criteria for the Virtual Care and Social Determinant Interventions for the management of hypertension trial.  The informed consent form, study requirements and expectations were reviewed with the subject by Dr. Duke Salvia and myself. The subject was given the opportunity to read the consent and ask questions. The subject verbalized understanding of the trial requirements.  All questions were addressed prior to the signing of the consent form. The subject agreed to participate in the trial and signed the informed consent. The informed consent was obtained prior to performance of any protocol-specific procedures for the subject.  A copy of the signed informed consent was given to the subject and a copy was placed in the subject's medical record.  Pamela Carroll was randomized to Group 2.

## 2022-11-24 NOTE — Progress Notes (Signed)
Advanced Hypertension Clinic Initial Assessment:    Date:  11/24/2022   ID:  Pamela Carroll, DOB 03/03/1981, MRN [id number]  PCP:  Augustine Radar, FNP  Cardiologist:  None  Nephrologist:  Referring MD: Augustine Radar, FNP   CC: Hypertension  History of Present Illness:    Pamela Carroll is a 42 y.o. female with a hx of hypertension, hyperlipidemia, DM2 here to establish care in the Advanced Hypertension Clinic.   Referred by her primary care provider Augustine Radar, FNP. BP at clinic visit 06/25/22 and 10/07/22 with  BP's 160/100. Pamela Carroll was hesitant regarding medications and referred to the advanced hypertension clinic.  Pamela Carroll was diagnosed with hypertension a year ago. Has never taken blood pressure medications. Prefers to manage things with lifestyle changes if possible. Blood pressure checked with wrist cuff at home. Readings have been most often in the 160s-170s/90s.  She has not had cuff checked for accuracy.  Does note she found out her uncle has recurrent cancer while in the waiting room prior to appointment which she attributes her markedly elevated BP today.   Lives at home with her husband. They have two girls who are 62 and 43 years old. 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  Notes drinking Mountain Dew 3-4 cans per day and water otherwise.  she reports tobacco use never. No alcohol use. For exercise she is walking and a small amount of strength training. Exercising at the gym at her work. Also walks on her lunch break.  she eats at home and outside of the home and does not follow low sodium diet.  Has recently been trying to make more changes towards a low-sodium diet.  Notes occasionally gets a left sided  left sided chest discomfort which she describes as a "twinge "at rest that resolves with gentle stretching.   Discussed likely etiology musculoskeletal. Reports no shortness of breath nor dyspnea on exertion. No edema, orthopnea, PND. Reports no palpitations.    Her husband tells her she snores. She usually gets 4-6 hours of sleep per night. Has difficulty falling asleep and getting her mind quiet. Often wakes up feeling tired.   Labs at outside health system: 05/16/22 K 3.6, creatinine 0.84, AST 22, ALT 16,  05/16/22 total cholesterol 169, triglycerides 112, HDL 36, LDL 111 05/16/2022 hemoglobin 12.1, hematocrit 37.2, platelets 289 05/16/22 A1c 6.5  Previous antihypertensives: None  Past Medical History:  Diagnosis Date   Alopecia    DM2 (diabetes mellitus, type 2)    Gestational diabetes    Hyperlipidemia    Hypertension    Joint pain     Past Surgical History:  Procedure Laterality Date   BREAST SURGERY     CESAREAN SECTION     x2    Current Medications: Current Meds  Medication Sig   Prenatal Vit-Fe Fumarate-FA (PRENATAL COMPLETE) 14-0.4 MG TABS Take 1 tablet by mouth daily after breakfast.     Allergies:   Patient has no known allergies.   Social History   Socioeconomic History   Marital status: Married    Spouse name: Not on file   Number of children: Not on file   Years of education: Not on file   Highest education level: Not on file  Occupational History   Not on file  Tobacco Use   Smoking status: Never   Smokeless tobacco: Never  Substance  and Sexual Activity   Alcohol use: Yes    Comment: socially   Drug use: No   Sexual activity: Not on file  Other Topics Concern   Not on file  Social History Narrative   Not on file   Social Determinants of Health   Financial Resource Strain: Low Risk  (11/24/2022)   Overall Financial Resource Strain (CARDIA)    Difficulty of Paying Living Expenses: Not very hard  Food Insecurity: No Food Insecurity (11/24/2022)   Hunger Vital Sign    Worried About Running Out of Food in the Last Year: Never true    Ran Out of Food  in the Last Year: Never true  Transportation Needs: No Transportation Needs (11/24/2022)   PRAPARE - Administrator, Civil Service (Medical): No    Lack of Transportation (Non-Medical): No  Physical Activity: Sufficiently Active (11/24/2022)   Exercise Vital Sign    Days of Exercise per Week: 4 days    Minutes of Exercise per Session: 40 min  Stress: Not on file  Social Connections: Not on file     Family History: The patient's family history includes Hypertension in her father and mother; Other in an other family member.  ROS:   Please see the history of present illness.    All other systems reviewed and are negative.  EKGs/Labs/Other Studies Reviewed:    EKG:  EKG is ordered today.  The ekg ordered today demonstrates NSR 73 bpm.   Recent Labs: No results found for requested labs within last 365 days.   Recent Lipid Panel No results found for: "CHOL", "TRIG", "HDL", "CHOLHDL", "VLDL", "LDLCALC", "LDLDIRECT"  Physical Exam:   VS:  BP (!) 162/94   Pulse 73   Ht 5\' 9"  (1.753 m)   Wt 262 lb (118.8 kg)   BMI 38.69 kg/m  , BMI Body mass index is 38.69 kg/m. GENERAL:  Well appearing ,overweight HEENT: Pupils equal round and reactive, fundi not visualized, oral mucosa unremarkable NECK:  No jugular venous distention, waveform within normal limits, carotid upstroke brisk and symmetric, no bruits, no thyromegaly LYMPHATICS:  No cervical adenopathy LUNGS:  Clear to auscultation bilaterally HEART:  RRR.  PMI not displaced or sustained,S1 and S2 within normal limits, no S3, no S4, no clicks, no rubs,  murmurs ABD:  Flat, positive bowel sounds normal in frequency in pitch, no bruits, no rebound, no guarding, no midline pulsatile mass, no hepatomegaly, no splenomegaly EXT:  2 plus pulses throughout, no edema, no cyanosis no clubbing SKIN:  No rashes no nodules NEURO:  Cranial nerves II through XII grossly intact, motor grossly intact throughout PSYCH:  Cognitively intact,  oriented to person place and time   ASSESSMENT/PLAN:    HTN-per her report diagnosed with hypertension 1 year ago.  Has never been on antihypertensive regimen.  She prefers lifestyle changes.  We discussed possible concomitant utilization of medical therapy and lifestyle changes.  Enrolled in advanced hypertension research study with [employer] remote patient monitoring.  Check in on blood pressure in 2 weeks and consider addition of amlodipine at that time.  Lengthy discussion regarding increasing physical activity (given information for Right Start program at [facility]), reducing sodium in diet, reducing caffeine intake.  Will plan for secondary hypertension workup including: thyroid panel, renin-aldosterone, catecholamines, metanephrines, cortisol, renal duplex, sleep study.  Snores / Sleep disordered breathing - STOP Bang 5. Itamar home sleep study provided to assess for sleep apnea.   Obesity - Weight loss via diet and  exercise encouraged. Discussed the impact being overweight would have on cardiovascular risk.  Motivated to lose weight.  DM2 - 05/2022 A1c 6.5. Continue to follow with PCP. Presently controlled with diet and exercise.   HLD -05/2022 total cholesterol 169, HDL 36, LDL 111.  10-year ASCVD risk 11.8% which is intermediate.  She prefers lifestyle changes which are detailed above.  Consider repeat lipid panel 3 to 6 months after lifestyle changes are made to reassess cardiovascular risk and possible need for statin.  Screening for Secondary Hypertension:     11/24/2022    1:29 PM  Causes  Drugs/Herbals Screened     - Comments no OTC agents. will reduce caffeine intake.  Renovascular HTN Screened  Sleep Apnea Screened  Thyroid Disease Screened  Pheochromocytoma Screened  Cushing's Syndrome Screened    Relevant Labs/Studies:    Latest Ref Rng & Units 11/08/2012   10:44 AM 01/15/2010    4:04 PM  Basic Labs  Sodium 135 - 145 mEq/L 142  141   Potassium 3.5 - 5.1 mEq/L 4.0  4.2    Creatinine 0.50 - 1.10 mg/dL 1.61  0.96                    11/24/2022   10:24 AM  Renovascular   Renal Artery [place] Completed Yes      she consents to be monitored in our remote patient monitoring program through [employer].  she will track his blood pressure twice daily and understands that these trends will help [place] to adjust her medications as needed prior to his next appointment.  she  interested in enrolling in the Right Start exercise program at [facility].  Disposition:    FU with MD/PharmD in 2 months    Medication Adjustments/Labs and Tests Ordered: Current medicines are reviewed at length with the patient today.  Concerns regarding medicines are outlined above.  Orders Placed This Encounter  Procedures   Thyroid Panel With TSH   Metanephrines, plasma   Basic metabolic panel   Catecholamines, fractionated, plasma   Cortisol   EKG 12-Lead   Itamar Sleep Study   VAS US RENAL ARTERY DUPLEX   No orders of the defined types were placed in this encounter.    Signed, Alver Sorrow, NP  11/24/2022 1:30 PM    [facility]

## 2022-11-24 NOTE — Addendum Note (Signed)
Addended by: Alver Sorrow on: 11/24/2022 04:27 PM   Modules accepted: Orders

## 2022-11-25 ENCOUNTER — Telehealth: Payer: Self-pay

## 2022-11-25 DIAGNOSIS — Z0000 Encounter for general adult medical examination without abnormal findings: Secondary | ICD-10-CM

## 2022-11-25 NOTE — Telephone Encounter (Signed)
Called patient and conducted welcome call. Patient requested her that her morning prompt be changed to 6:30am. Patient prompt time has been adjusted per request. Inquired if patient would like to participate in health coaching program for support around healthy eating per her survey response. Patient was recommended to follow the DASH diet by Gillian Shields. Had a discussion around monitoring sodium intake and reading food labels. Patient is considering if she will need the additional support. Tested chat with patient to ensure she is able to use feature. Patient was provided with contact information.    Renaee Munda, MS, ERHD, [facility]  Care Guide, Health & Wellness Coach [address], [address] [place] [address] Telephone: [phone] Email: [email or web address]

## 2022-11-28 ENCOUNTER — Telehealth: Payer: Self-pay | Admitting: *Deleted

## 2022-11-28 NOTE — Telephone Encounter (Signed)
Called patient and informed her of approval, Pin code provided. Patient verbalizes understanding and states she will complete

## 2022-11-28 NOTE — Telephone Encounter (Signed)
Ok to contact the patient to activate itamar device. Insurance does not require a prior [place]. Serena Croissant will be notified to contact the patient.

## 2022-12-20 ENCOUNTER — Telehealth (HOSPITAL_BASED_OUTPATIENT_CLINIC_OR_DEPARTMENT_OTHER): Payer: Self-pay | Admitting: Family

## 2022-12-20 NOTE — Telephone Encounter (Signed)
Received the following from eLink team  "[name] Oct 22, 1980   2 week average BP 166/95, next appointment 5/16   thanks  Patty RN @ [facility] "  At clinic visit Pamela Carroll politely declined antihypertensives as she wished to trial natural approach. BP persistently elevated.  Office visit and renal duplex upcoming this week. Labs for screening of secondary hypertension not yet collected.  Will ask nursing team to call: Reminder to have labs done. These need to be fasting in the morning.  Recommend addition of Amlodipine 5mg  daily. That way she can trial a few doses prior to her upcoming clinic visit so we can assess response. This is a low dose of a well tolerated blood pressure medication.   Alver Sorrow, NP

## 2022-12-20 NOTE — Telephone Encounter (Signed)
Called patient and reviewed the following recommendations, patient does not want to try medication at this time and needs to reschedule her renal. Advised patient I will route to scheduling team to have renal rescheduled.     "Received the following from eLink team   "Pamela Carroll 1980/12/14   2 week average BP 166/95, next appointment 5/16   thanks  Patty RN @ [facility] "   At clinic visit Pamela Carroll politely declined antihypertensives as she wished to trial natural approach. BP persistently elevated.  Office visit and renal duplex upcoming this week. Labs for screening of secondary hypertension not yet collected.   Will ask nursing team to call:  Reminder to have labs done. These need to be fasting in the morning.   Recommend addition of Amlodipine 5mg  daily. That way she can trial a few doses prior to her upcoming clinic visit so we can assess response. This is a low dose of a well tolerated blood pressure medication.    Alver Sorrow, NP"

## 2022-12-20 NOTE — Telephone Encounter (Signed)
Left message for patient to call and discuss rescheduling (per patient request) the renal doppler scheduled 12/22/22 here at [facility]

## 2022-12-20 NOTE — Telephone Encounter (Signed)
Left message for patient to call and discuss rescheduling the 12/22/22 Renal doppler (per pt request)

## 2022-12-21 NOTE — Telephone Encounter (Signed)
Left message for patient to call and discuss rescheduling the renal doppler scheduled for Thursday 12/22/22 at 8:00 am (pt request)

## 2022-12-22 ENCOUNTER — Encounter (HOSPITAL_BASED_OUTPATIENT_CLINIC_OR_DEPARTMENT_OTHER): Payer: BC Managed Care – PPO

## 2022-12-23 NOTE — Telephone Encounter (Signed)
Left message for patient to call and reschedule the Renal duplex appointment that was cancelled on 12/22/22

## 2023-01-12 ENCOUNTER — Telehealth (HOSPITAL_BASED_OUTPATIENT_CLINIC_OR_DEPARTMENT_OTHER): Payer: Managed Care, Other (non HMO) | Admitting: Family

## 2023-01-12 ENCOUNTER — Telehealth (HOSPITAL_BASED_OUTPATIENT_CLINIC_OR_DEPARTMENT_OTHER): Payer: Self-pay | Admitting: Family

## 2023-01-12 NOTE — Telephone Encounter (Signed)
Patient wants call back regarding instructions for video visit today.

## 2023-01-12 NOTE — Telephone Encounter (Signed)
Spoke with patient who wanted to reschedule her renal duplex and her visit  Messaged Joyce Gross to get rescheduled

## 2023-01-16 ENCOUNTER — Telehealth: Payer: Self-pay

## 2023-01-16 DIAGNOSIS — Z0000 Encounter for general adult medical examination without abnormal findings: Secondary | ICD-10-CM

## 2023-01-16 NOTE — Telephone Encounter (Signed)
Called patient regarding missing bp readings in [employer]. Patient stated that while on vacation she accidentally left the cuff in the hotel she stayed in and just recently was able to regain access to it. Patient stated that she will resume checking her bp but expressed that she has trouble accessing the app because it freezes. Patient shared that she has uninstalled the app and reset her security answers but it has not helped. Submitted a ticket to tech support for additional follow up. Patient's case # is [id number].   Renaee Munda, MS, ERHD, [facility]  Care Guide, Health & Wellness Coach [address] [place] [address] Telephone: [phone] Email: [email or web address]

## 2023-01-25 ENCOUNTER — Ambulatory Visit (INDEPENDENT_AMBULATORY_CARE_PROVIDER_SITE_OTHER): Payer: BC Managed Care – PPO

## 2023-01-25 DIAGNOSIS — I10 Essential (primary) hypertension: Secondary | ICD-10-CM | POA: Diagnosis not present

## 2023-01-30 ENCOUNTER — Telehealth (HOSPITAL_BASED_OUTPATIENT_CLINIC_OR_DEPARTMENT_OTHER): Payer: Self-pay

## 2023-01-30 NOTE — Telephone Encounter (Addendum)
Results called to patient who verbalizes understanding!    ----- Message from Alver Sorrow, NP sent at 01/25/2023  3:06 PM EDT ----- No evidence of renal artery stenosis. 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 IF noting abdominal pain after eating, recommend further evaluation with referral to [facility]. If no abdominal pain after eating, no need for intervention at this time.   Incidental finding of singular gallstone. If having abdominal pain, recommend abdominal US. If no pain, simply follow up routinely with PCP.  Has not checked BP In [employer] RPM system in >10 days. Pleaes remind her to monitor BP and to have labs collected as ordered at April appointment.

## 2023-03-01 ENCOUNTER — Telehealth: Payer: Self-pay | Admitting: Family

## 2023-03-01 NOTE — Telephone Encounter (Signed)
Patient was calling to make another mychart visit because she couldn't make tomorrow for work. Please advise

## 2023-03-02 ENCOUNTER — Telehealth (HOSPITAL_BASED_OUTPATIENT_CLINIC_OR_DEPARTMENT_OTHER): Payer: BC Managed Care – PPO | Admitting: Family

## 2023-03-02 ENCOUNTER — Telehealth (HOSPITAL_BASED_OUTPATIENT_CLINIC_OR_DEPARTMENT_OTHER): Payer: Managed Care, Other (non HMO) | Admitting: Family

## 2023-03-02 NOTE — Telephone Encounter (Signed)
Left message for patient to call back  

## 2023-03-03 NOTE — Telephone Encounter (Signed)
2nd call attempt, no answer, left message to call back!  

## 2023-03-06 ENCOUNTER — Encounter (HOSPITAL_BASED_OUTPATIENT_CLINIC_OR_DEPARTMENT_OTHER): Payer: Self-pay

## 2023-03-06 NOTE — Telephone Encounter (Signed)
3rd contact attempt, no answer, left message to call back for scheduling, letter mailed to patient.

## 2023-04-03 DIAGNOSIS — Z6838 Body mass index (BMI) 38.0-38.9, adult: Secondary | ICD-10-CM | POA: Diagnosis not present

## 2023-04-03 DIAGNOSIS — R0683 Snoring: Secondary | ICD-10-CM | POA: Diagnosis not present

## 2023-04-03 DIAGNOSIS — I10 Essential (primary) hypertension: Secondary | ICD-10-CM | POA: Diagnosis not present

## 2023-04-03 DIAGNOSIS — G4730 Sleep apnea, unspecified: Secondary | ICD-10-CM | POA: Diagnosis not present

## 2023-04-05 ENCOUNTER — Encounter (HOSPITAL_BASED_OUTPATIENT_CLINIC_OR_DEPARTMENT_OTHER): Payer: BC Managed Care – PPO | Admitting: Cardiovascular Disease

## 2023-04-12 ENCOUNTER — Telehealth (HOSPITAL_BASED_OUTPATIENT_CLINIC_OR_DEPARTMENT_OTHER): Payer: Self-pay

## 2023-04-12 NOTE — Telephone Encounter (Addendum)
Results called to patient who verbalizes understanding!    ----- Message from Alver Sorrow sent at 04/12/2023  9:22 AM EDT ----- Normal renin and aldosterone level.  Good result!

## 2023-05-31 ENCOUNTER — Encounter (HOSPITAL_BASED_OUTPATIENT_CLINIC_OR_DEPARTMENT_OTHER): Payer: BC Managed Care – PPO | Admitting: Cardiovascular Disease

## 2023-05-31 NOTE — Progress Notes (Incomplete)
Advanced Hypertension Clinic Follow-up:    Date:  05/31/2023   ID:  Pamela Carroll, DOB 18-Sep-1980, MRN [id number]  PCP:  Augustine Radar, FNP  Cardiologist:  None  Nephrologist:  Referring MD: Augustine Radar, FNP   CC: Hypertension  History of Present Illness:    Pamela Carroll is a 42 y.o. female with a hx of hypertension, hyperlipidemia, type 2 diabetes, obesity, here for follow-up today. She was initially seen in the [facility] by Gillian Shields, NP on 11/24/2022. At that visit her blood pressure was 162/94. She had been diagnosed with hypertension one year prior and had never been on antihypertensives as she preferred to manage things with lifestyle changes. Home readings were averaging 160s-170s/90s per her wrist cuff which had not yet been validated. She complained of occasional left-sided "twinge" at rest that resolved with gentle stretching. She was enrolled in the [employer] RPM study and given information for Right Start program. Given snoring and insomnia she was provided with an [place] sleep study. After 2 weeks her average BP in [employer] was 166/95 and it was recommended to start 5 mg amlodipine but she declined.  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 As of 03/2023, renin/aldosterone, catecholamines, metanephrines, cortisol, and TSH were normal.  Today,  She denies any palpitations, chest pain, shortness of breath, peripheral edema, lightheadedness, headaches, syncope, orthopnea, or PND.  (+)  Previous antihypertensives: N/a  Past Medical History:  Diagnosis Date   Alopecia    DM2 (diabetes mellitus, type 2) (HCC)    Gestational diabetes    Hyperlipidemia    Hypertension    Joint pain     Past Surgical History:  Procedure Laterality Date   BREAST SURGERY     CESAREAN SECTION     x2     Current Medications: No outpatient medications have been marked as taking for the 05/31/23 encounter (Appointment) with Chilton Si, MD.     Allergies:   Patient has no known allergies.   Social History   Socioeconomic History   Marital status: Married    Spouse name: Not on file   Number of children: Not on file   Years of education: Not on file   Highest education level: Not on file  Occupational History   Not on file  Tobacco Use   Smoking status: Never   Smokeless tobacco: Never  Substance and Sexual Activity   Alcohol use: Yes    Comment: socially   Drug use: No   Sexual activity: Not on file  Other Topics Concern   Not on file  Social History Narrative   Not on file   Social Determinants of Health   Financial Resource Strain: Low Risk  (11/24/2022)   Overall Financial Resource Strain (CARDIA)    Difficulty of Paying Living Expenses: Not very hard  Food Insecurity: No Food Insecurity (11/24/2022)   Hunger Vital Sign    Worried About Running Out of Food in the Last Year: Never true    Ran Out of Food in the Last Year: Never true  Transportation Needs: No Transportation Needs (11/24/2022)   PRAPARE - Administrator, Civil Service (Medical): No    Lack of Transportation (Non-Medical): No  Physical Activity: Sufficiently Active (11/24/2022)   Exercise Vital Sign    Days of Exercise per Week: 4 days    Minutes of Exercise  per Session: 40 min  Stress: Not on file  Social Connections: Not on file     Family History: The patient's family history includes Hypertension in her father and mother; Other in an other family member.  ROS:   Please see the history of present illness.     All other systems reviewed and are negative.  EKGs/Labs/Other Studies Reviewed:    Renal Artery Dopplers  01/25/2023: Summary:  Largest Aortic Diameter: 2.4 cm    Renal:    Right: Normal size right kidney. Normal right Resisitive Index.         Normal cortical  thickness of right kidney. No evidence of         right renal artery stenosis. RRV flow present.  Left:  Normal size of left kidney. Normal left Resistive Index.         Normal cortical thickness of the left kidney. No evidence of         left renal artery stenosis. LRV flow present.  Mesenteric:  Normal Celiac artery findings. 70 to 99% stenosis in the superior mesenteric artery.    Incidental finding of singular gallstone without gallbladder wall thickening and normal CBD. If clinically indicated, a dedicated abdominal ultrasound is recommended.   EKG:  EKG is personally reviewed. 05/31/2023: Sinus ***. Rate *** bpm.  Recent Labs: 04/03/2023: BUN 10; Creatinine, Ser 0.82; Potassium 4.2; Sodium 142; TSH 1.220   Recent Lipid Panel No results found for: "CHOL", "TRIG", "HDL", "CHOLHDL", "VLDL", "LDLCALC", "LDLDIRECT"  Physical Exam:    VS:  There were no vitals taken for this visit. , BMI There is no height or weight on file to calculate BMI. GENERAL:  Well appearing HEENT: Pupils equal round and reactive, fundi not visualized, oral mucosa unremarkable NECK:  No jugular venous distention, waveform within normal limits, carotid upstroke brisk and symmetric, no bruits, no thyromegaly LYMPHATICS:  No cervical adenopathy LUNGS:  Clear to auscultation bilaterally HEART:  RRR.  PMI not displaced or sustained, S1 and S2 within normal limits, no S3, no S4, no clicks, no rubs, *** murmurs ABD:  Flat, positive bowel sounds normal in frequency in pitch, no bruits, no rebound, no guarding, no midline pulsatile mass, no hepatomegaly, no splenomegaly EXT:  2 plus pulses throughout, no edema, no cyanosis, no clubbing SKIN:  No rashes, no nodules NEURO:  Cranial nerves II through XII grossly intact, motor grossly intact throughout PSYCH:  Cognitively intact, oriented to person place and time   ASSESSMENT/PLAN:    No problem-specific Assessment & Plan notes found for this  encounter.  ***Plan: -  Screening for Secondary Hypertension: { Click here to document screening for secondary causes of HTN  :[id number]}    11/24/2022    1:29 PM  Causes  Drugs/Herbals Screened     - Comments no OTC agents. will reduce caffeine intake.  Renovascular HTN Screened  Sleep Apnea Screened  Thyroid Disease Screened  Pheochromocytoma Screened  Cushing's Syndrome Screened    Relevant Labs/Studies:    Latest Ref Rng & Units 04/03/2023    9:48 AM 11/08/2012   10:44 AM 01/15/2010    4:04 PM  Basic Labs  Sodium 134 - 144 mmol/L 142  142  141   Potassium 3.5 - 5.2 mmol/L 4.2  4.0  4.2   Creatinine 0.57 - 1.00 mg/dL 4.40  1.02  7.25        Latest Ref Rng & Units 04/03/2023    9:48 AM  Thyroid   TSH 0.450 -  4.500 uIU/mL 1.220        Latest Ref Rng & Units 04/03/2023    9:36 AM  Renin/Aldosterone   Aldosterone 0.0 - 30.0 ng/dL 6.2   Aldos/Renin Ratio 0.0 - 30.0 25.8        Latest Ref Rng & Units 04/03/2023    9:48 AM  Metanephrines/Catecholamines   Epinephrine 0 - 62 pg/mL 53   Norepinephrine 0 - 874 pg/mL 224   Dopamine 0 - 48 pg/mL <30   Metanephrines 0.0 - 88.0 pg/mL <25.0   Normetanephrines  0.0 - 218.9 pg/mL 62.1           01/25/2023    9:56 AM  Renovascular   Renal Artery [place] Completed Yes    she consents to be monitored in our remote patient monitoring program through [employer].  she will track his blood pressure twice daily and understands that these trends will help [place] to adjust her medications as needed prior to his next appointment.  she *** interested in enrolling in the PREP exercise and nutrition program through the [facility].    Disposition:    FU with APP/PharmD in 1 month for the next 3 months.   [facility] ***4 months.  Medication Adjustments/Labs and Tests Ordered: Current medicines are reviewed at length with the patient today.  Concerns regarding medicines are outlined above.   No orders of the defined types were  placed in this encounter.  No orders of the defined types were placed in this encounter.  I,Mathew Stumpf,acting as a Neurosurgeon for Chilton Si, MD.,have documented all relevant documentation on the behalf of Chilton Si, MD,as directed by  Chilton Si, MD while in the presence of Chilton Si, MD.  ***  Signed, Carlena Bjornstad  05/31/2023 8:08 AM    [facility]

## 2023-06-01 ENCOUNTER — Telehealth: Payer: Self-pay

## 2023-06-01 DIAGNOSIS — Z0000 Encounter for general adult medical examination without abnormal findings: Secondary | ICD-10-CM

## 2023-06-01 NOTE — Telephone Encounter (Signed)
Called to advise patient to return Viv[employer] machine. Patient stated that she could never get the app to work. Patient shared that she will bring the device in the week of 06/05/23. Informed patient that I would follow up with her if we had not received the device by 11/1. Patient verbally expressed understanding.    Renaee Munda, MS, ERHD, [facility]  Care Guide, Health & Wellness Coach [address], [address] [place] [address] Telephone: [phone] Email: [email or web address]

## 2023-06-19 DIAGNOSIS — Z1231 Encounter for screening mammogram for malignant neoplasm of breast: Secondary | ICD-10-CM | POA: Diagnosis not present

## 2023-06-20 ENCOUNTER — Encounter (HOSPITAL_BASED_OUTPATIENT_CLINIC_OR_DEPARTMENT_OTHER): Payer: BC Managed Care – PPO | Admitting: Family

## 2023-10-05 DIAGNOSIS — E782 Mixed hyperlipidemia: Secondary | ICD-10-CM | POA: Diagnosis not present

## 2023-10-05 DIAGNOSIS — R32 Unspecified urinary incontinence: Secondary | ICD-10-CM | POA: Diagnosis not present

## 2023-10-05 DIAGNOSIS — I10 Essential (primary) hypertension: Secondary | ICD-10-CM | POA: Diagnosis not present

## 2023-10-05 DIAGNOSIS — E118 Type 2 diabetes mellitus with unspecified complications: Secondary | ICD-10-CM | POA: Diagnosis not present

## 2023-10-06 DIAGNOSIS — E1165 Type 2 diabetes mellitus with hyperglycemia: Secondary | ICD-10-CM | POA: Diagnosis not present

## 2023-10-06 DIAGNOSIS — Z6839 Body mass index (BMI) 39.0-39.9, adult: Secondary | ICD-10-CM | POA: Diagnosis not present

## 2023-10-06 DIAGNOSIS — I161 Hypertensive emergency: Secondary | ICD-10-CM | POA: Diagnosis not present

## 2023-10-10 DIAGNOSIS — R1011 Right upper quadrant pain: Secondary | ICD-10-CM | POA: Diagnosis not present

## 2023-10-11 DIAGNOSIS — R1011 Right upper quadrant pain: Secondary | ICD-10-CM | POA: Diagnosis not present

## 2023-10-11 DIAGNOSIS — K8000 Calculus of gallbladder with acute cholecystitis without obstruction: Secondary | ICD-10-CM | POA: Diagnosis not present

## 2023-10-11 DIAGNOSIS — K8020 Calculus of gallbladder without cholecystitis without obstruction: Secondary | ICD-10-CM | POA: Diagnosis not present

## 2023-10-11 DIAGNOSIS — K828 Other specified diseases of gallbladder: Secondary | ICD-10-CM | POA: Diagnosis not present

## 2023-10-13 DIAGNOSIS — R1011 Right upper quadrant pain: Secondary | ICD-10-CM | POA: Diagnosis not present

## 2023-10-16 DIAGNOSIS — R1011 Right upper quadrant pain: Secondary | ICD-10-CM | POA: Diagnosis not present

## 2023-10-16 DIAGNOSIS — N200 Calculus of kidney: Secondary | ICD-10-CM | POA: Diagnosis not present

## 2023-10-16 DIAGNOSIS — K760 Fatty (change of) liver, not elsewhere classified: Secondary | ICD-10-CM | POA: Diagnosis not present

## 2023-10-16 DIAGNOSIS — K828 Other specified diseases of gallbladder: Secondary | ICD-10-CM | POA: Diagnosis not present

## 2023-10-16 DIAGNOSIS — K8020 Calculus of gallbladder without cholecystitis without obstruction: Secondary | ICD-10-CM | POA: Diagnosis not present

## 2023-10-23 ENCOUNTER — Telehealth: Payer: Self-pay

## 2023-10-23 NOTE — Telephone Encounter (Signed)
**Note De-Identified Dorthea Maina Obfuscation** 2nd attempt Ordering provider: Gillian Shields, NP Associated diagnoses: Sleep-disordered breathing-G47.30 and Snoring-R06.83  WatchPAT PA obtained on 10/23/2023 by Bing Duffey, Lorelle Formosa, LPN. Authorization: PA Not Required per [employer].  Patient notified of PIN (1234) on 10/23/2023 Lynzy Rawles Notification Method: phone-no answer so I left a message on the pts VM (Ok per [facility]) advising her of the Pin # and that this is our 2nd attempt of contacting her to either do the HST or to return it in its original unopened box to the office or she will be charged $100 for the unreturned device. I did leave my name and office phone number so she can call us back if she has questions..  Phone note routed to covering staff for follow-up.

## 2023-11-06 NOTE — Telephone Encounter (Signed)
 Called patient to remind of sleep study, no answer,left detailed message (ok per dpr) that study needs to be completed by 4/7 or returned to avoid billing.

## 2023-11-13 NOTE — Telephone Encounter (Signed)
 Test not completed or returned- message sent to billing

## 2023-11-16 DIAGNOSIS — B351 Tinea unguium: Secondary | ICD-10-CM | POA: Diagnosis not present

## 2023-11-16 DIAGNOSIS — B353 Tinea pedis: Secondary | ICD-10-CM | POA: Diagnosis not present

## 2023-12-31 DIAGNOSIS — I161 Hypertensive emergency: Secondary | ICD-10-CM | POA: Diagnosis not present

## 2023-12-31 DIAGNOSIS — E1165 Type 2 diabetes mellitus with hyperglycemia: Secondary | ICD-10-CM | POA: Diagnosis not present

## 2023-12-31 DIAGNOSIS — Z6839 Body mass index (BMI) 39.0-39.9, adult: Secondary | ICD-10-CM | POA: Diagnosis not present

## 2024-01-25 DIAGNOSIS — Z01419 Encounter for gynecological examination (general) (routine) without abnormal findings: Secondary | ICD-10-CM | POA: Diagnosis not present

## 2024-01-25 DIAGNOSIS — I10 Essential (primary) hypertension: Secondary | ICD-10-CM | POA: Diagnosis not present

## 2024-01-25 DIAGNOSIS — Z124 Encounter for screening for malignant neoplasm of cervix: Secondary | ICD-10-CM | POA: Diagnosis not present

## 2024-01-29 DIAGNOSIS — E1169 Type 2 diabetes mellitus with other specified complication: Secondary | ICD-10-CM | POA: Diagnosis not present

## 2024-01-29 DIAGNOSIS — F5101 Primary insomnia: Secondary | ICD-10-CM | POA: Diagnosis not present

## 2024-01-29 DIAGNOSIS — Z6839 Body mass index (BMI) 39.0-39.9, adult: Secondary | ICD-10-CM | POA: Diagnosis not present

## 2024-01-29 DIAGNOSIS — I10 Essential (primary) hypertension: Secondary | ICD-10-CM | POA: Diagnosis not present

## 2024-02-01 DIAGNOSIS — B351 Tinea unguium: Secondary | ICD-10-CM | POA: Diagnosis not present

## 2024-02-01 DIAGNOSIS — B353 Tinea pedis: Secondary | ICD-10-CM | POA: Diagnosis not present

## 2024-02-02 DIAGNOSIS — B353 Tinea pedis: Secondary | ICD-10-CM | POA: Diagnosis not present

## 2024-02-02 DIAGNOSIS — B351 Tinea unguium: Secondary | ICD-10-CM | POA: Diagnosis not present

## 2024-02-12 DIAGNOSIS — E1169 Type 2 diabetes mellitus with other specified complication: Secondary | ICD-10-CM | POA: Diagnosis not present

## 2024-02-12 DIAGNOSIS — I10 Essential (primary) hypertension: Secondary | ICD-10-CM | POA: Diagnosis not present

## 2024-02-12 DIAGNOSIS — F5101 Primary insomnia: Secondary | ICD-10-CM | POA: Diagnosis not present

## 2024-02-12 DIAGNOSIS — Z6839 Body mass index (BMI) 39.0-39.9, adult: Secondary | ICD-10-CM | POA: Diagnosis not present

## 2024-02-19 DIAGNOSIS — L219 Seborrheic dermatitis, unspecified: Secondary | ICD-10-CM | POA: Diagnosis not present

## 2024-02-19 DIAGNOSIS — L6681 Central centrifugal cicatricial alopecia: Secondary | ICD-10-CM | POA: Diagnosis not present

## 2024-02-19 DIAGNOSIS — L650 Telogen effluvium: Secondary | ICD-10-CM | POA: Diagnosis not present

## 2024-03-04 DIAGNOSIS — I10 Essential (primary) hypertension: Secondary | ICD-10-CM | POA: Diagnosis not present

## 2024-03-04 DIAGNOSIS — Z6839 Body mass index (BMI) 39.0-39.9, adult: Secondary | ICD-10-CM | POA: Diagnosis not present

## 2024-03-04 DIAGNOSIS — F5101 Primary insomnia: Secondary | ICD-10-CM | POA: Diagnosis not present

## 2024-03-04 DIAGNOSIS — E1169 Type 2 diabetes mellitus with other specified complication: Secondary | ICD-10-CM | POA: Diagnosis not present

## 2024-03-15 DIAGNOSIS — B351 Tinea unguium: Secondary | ICD-10-CM | POA: Diagnosis not present

## 2024-04-17 DIAGNOSIS — K8020 Calculus of gallbladder without cholecystitis without obstruction: Secondary | ICD-10-CM | POA: Diagnosis not present

## 2024-04-17 DIAGNOSIS — K760 Fatty (change of) liver, not elsewhere classified: Secondary | ICD-10-CM | POA: Diagnosis not present

## 2024-04-17 DIAGNOSIS — R1011 Right upper quadrant pain: Secondary | ICD-10-CM | POA: Diagnosis not present

## 2024-04-25 DIAGNOSIS — F4322 Adjustment disorder with anxiety: Secondary | ICD-10-CM | POA: Diagnosis not present

## 2024-04-25 DIAGNOSIS — F430 Acute stress reaction: Secondary | ICD-10-CM | POA: Diagnosis not present

## 2024-06-18 DIAGNOSIS — Z1331 Encounter for screening for depression: Secondary | ICD-10-CM | POA: Diagnosis not present

## 2024-06-18 DIAGNOSIS — K8020 Calculus of gallbladder without cholecystitis without obstruction: Secondary | ICD-10-CM | POA: Diagnosis not present

## 2024-06-18 DIAGNOSIS — R7303 Prediabetes: Secondary | ICD-10-CM | POA: Diagnosis not present

## 2024-06-19 DIAGNOSIS — Z1231 Encounter for screening mammogram for malignant neoplasm of breast: Secondary | ICD-10-CM | POA: Diagnosis not present

## 2024-07-11 ENCOUNTER — Encounter: Payer: Self-pay | Admitting: *Deleted

## 2024-07-11 NOTE — Progress Notes (Signed)
 Pamela Carroll                                          MRN: [id number]   07/11/2024   The [facility] Quality Team Specialist reviewed this patient medical record for the purposes of chart review for care gap closure. The following were reviewed: abstraction for care gap closure-controlling blood pressure.    VBCI Quality Team
